# Patient Record
Sex: Female | Born: 1970 | Race: Asian
Health system: Southern US, Academic
[De-identification: ages and names within clinical notes are randomized; demographics above are authoritative.]

## PROBLEM LIST (undated history)

## (undated) ENCOUNTER — Encounter

## (undated) HISTORY — PX: OVARIAN CYST SURGERY: SHX726

## (undated) HISTORY — PX: TONSILLECTOMY: SUR1361

## (undated) HISTORY — PX: BREAST REDUCTION SURGERY: SHX8

## (undated) HISTORY — PX: HAND TENDON SURGERY: SHX663

---

## 2007-06-10 ENCOUNTER — Emergency Department (HOSPITAL_COMMUNITY): Admission: EM | Admit: 2007-06-10 | Discharge: 2007-06-10 | Payer: Self-pay | Admitting: Emergency Medicine

## 2008-10-02 ENCOUNTER — Emergency Department (HOSPITAL_BASED_OUTPATIENT_CLINIC_OR_DEPARTMENT_OTHER): Admission: EM | Admit: 2008-10-02 | Discharge: 2008-10-02 | Payer: Self-pay | Admitting: Emergency Medicine

## 2008-10-02 ENCOUNTER — Ambulatory Visit: Payer: Self-pay | Admitting: Radiology

## 2010-08-31 IMAGING — CT CT HEAD W/O CM
1 series · 16 of 30 positions shown, 20 images · non-contrast
Comparison: None

CLINICAL DATA: Migraine headaches.

CT HEAD WITHOUT CONTRAST
TECHNIQUE: Contiguous axial images were obtained from the base of
the skull through the vertex without contrast

[Series 2: head 4.8 h37s · axial · 0.46mm/px · z∈[+1192,+1329]mm · 16 of 32 slices shown, 20 images]
[im 2/32  brain]
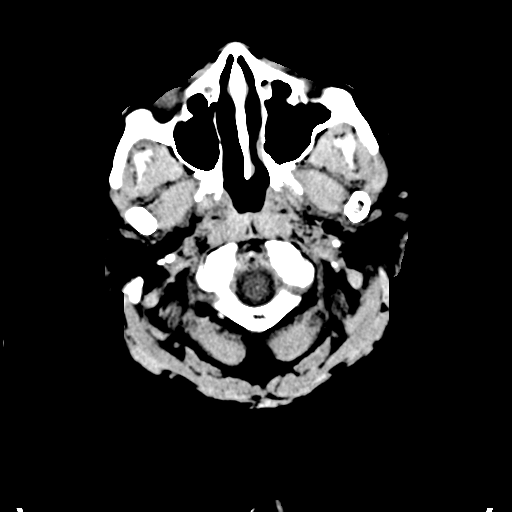
[im 2/32  bone]
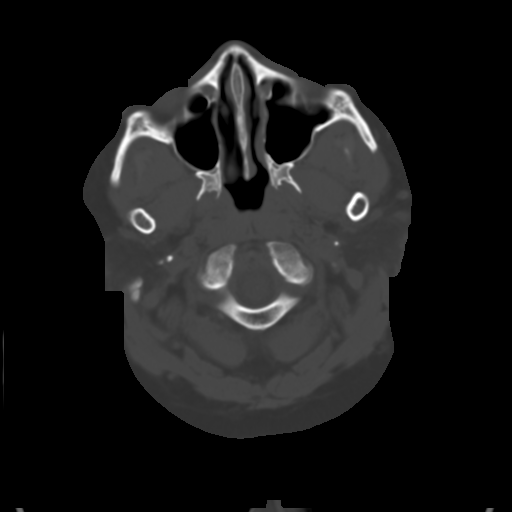
[im 4/32  brain]
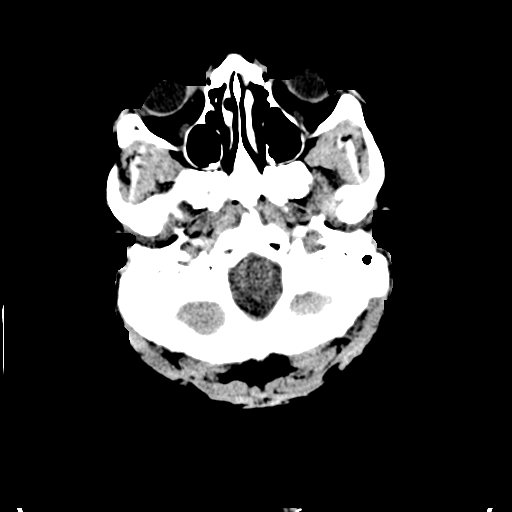
[im 6/32  brain]
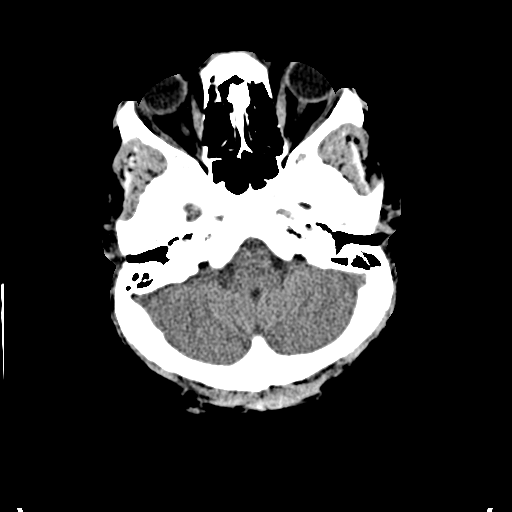
[im 8/32  brain]
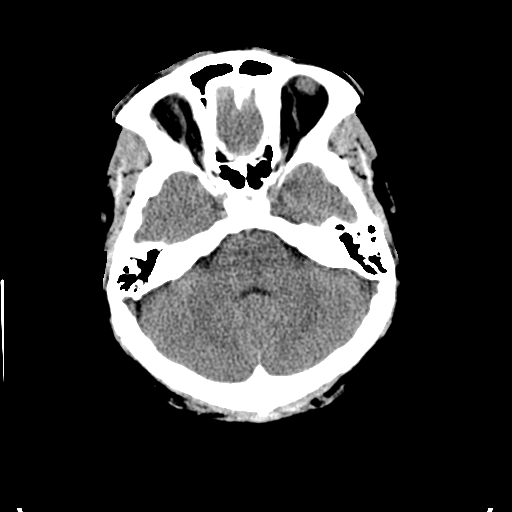
[im 9/32  brain]
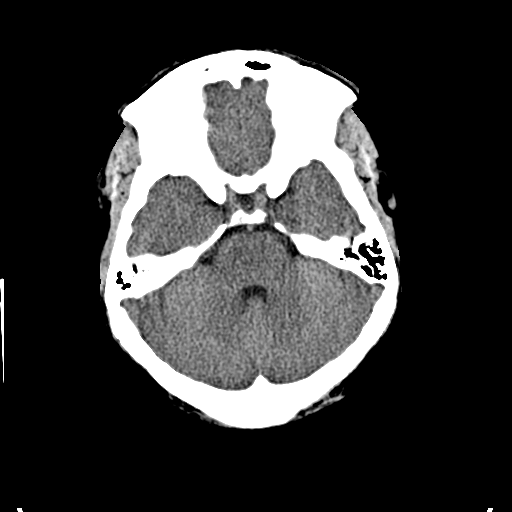
[im 9/32  bone]
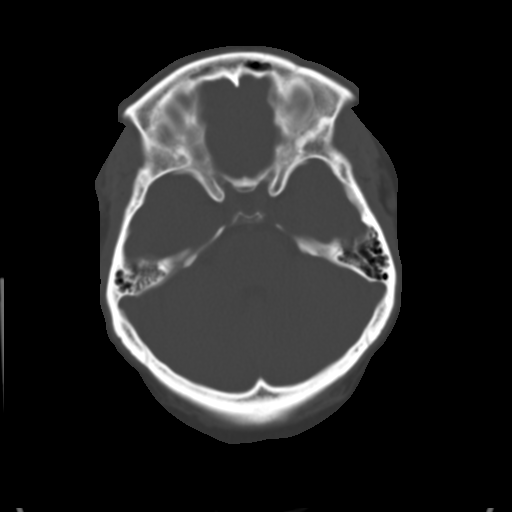
[im 11/32  brain]
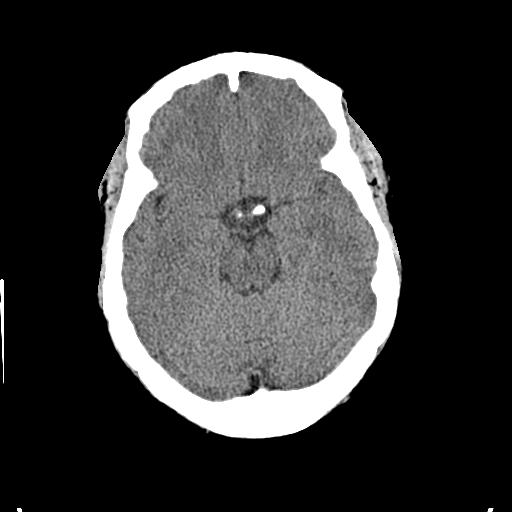
[im 13/32  brain]
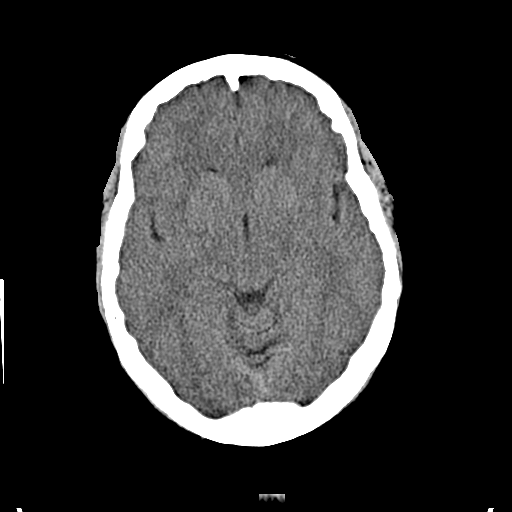
[im 15/32  brain]
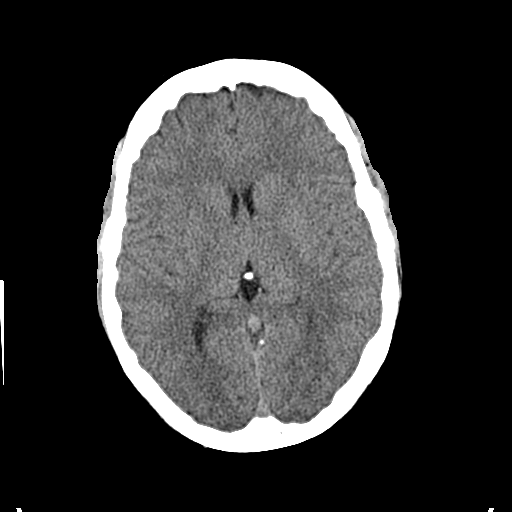
[im 17/32  brain]
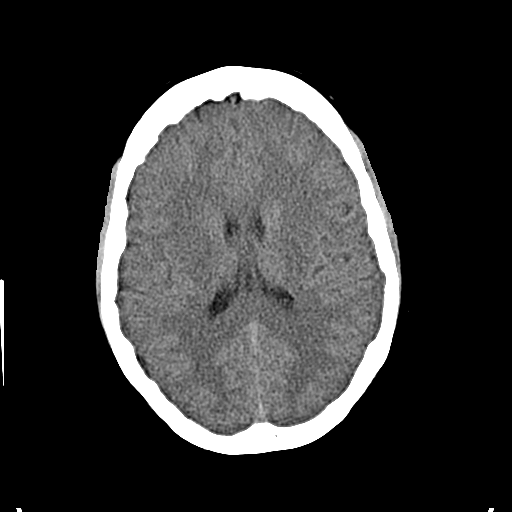
[im 17/32  bone]
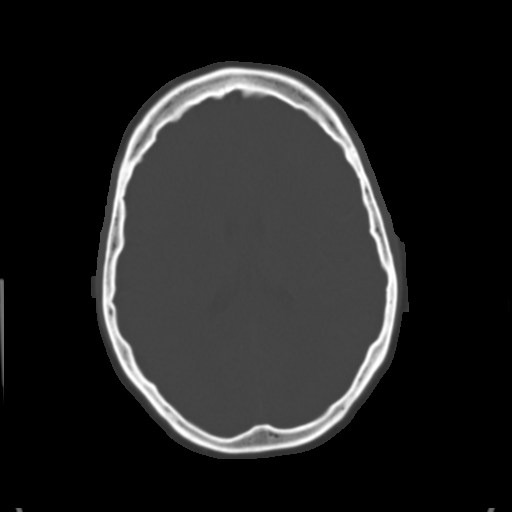
[im 19/32  brain]
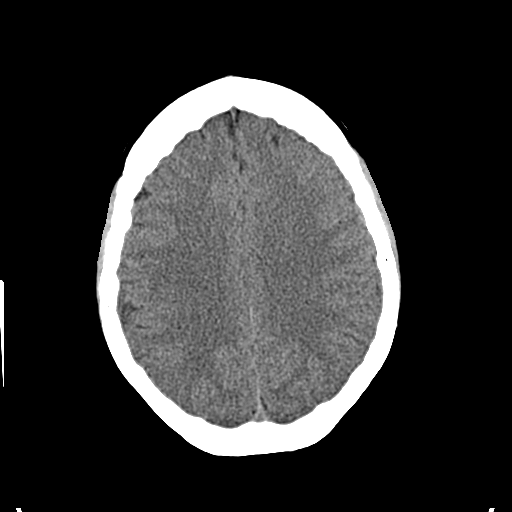
[im 21/32  brain]
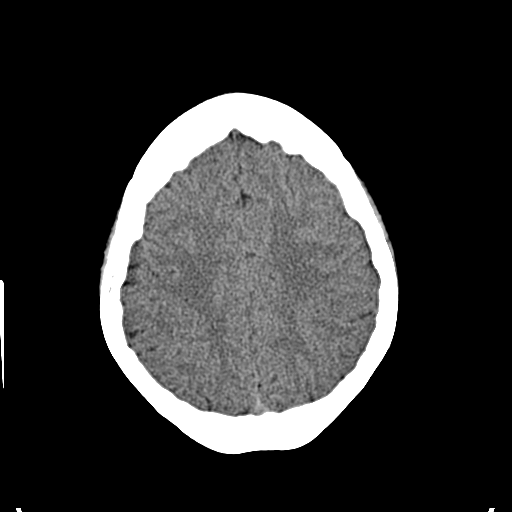
[im 23/32  brain]
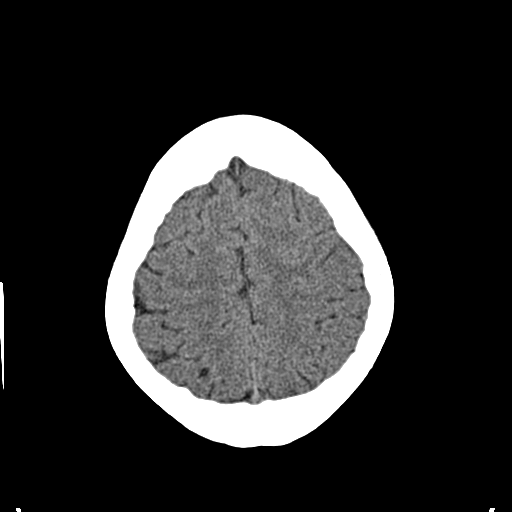
[im 24/32  brain]
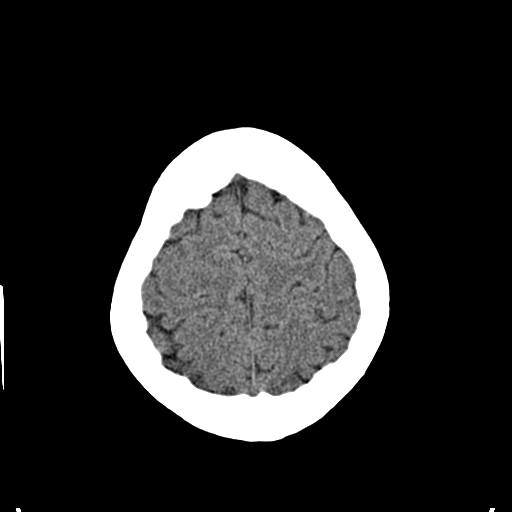
[im 24/32  bone]
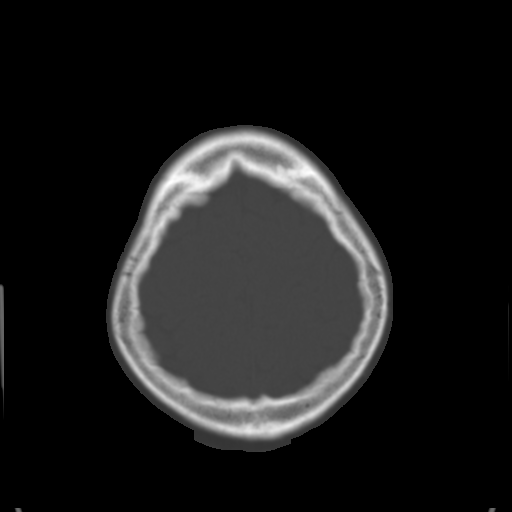
[im 26/32  brain]
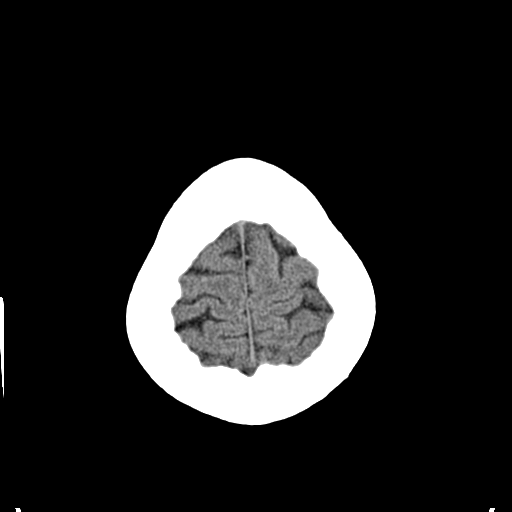
[im 28/32  brain]
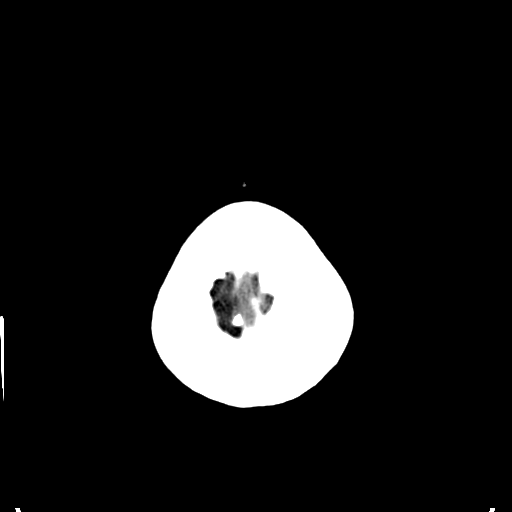
[im 30/32  brain]
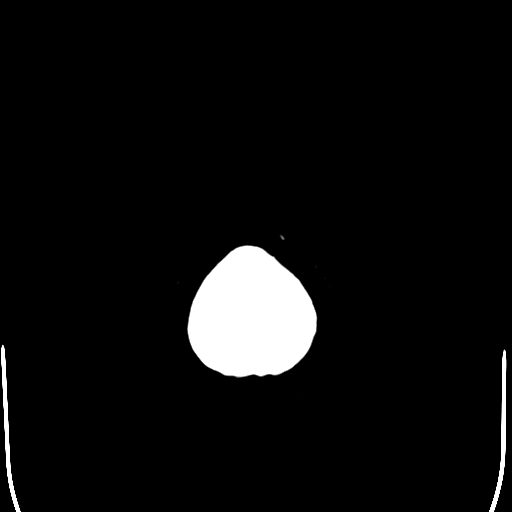

[16 of 30 positions shown; findings below may reference images not displayed]

FINDINGS: The brain has a normal appearance without evidence for
hemorrhage, acute infarction, hydrocephalus, or mass lesion.  There
is no extra axial fluid collection.  The skull and paranasal
sinuses are normal.
IMPRESSION: Normal CT of the head without contrast.

## 2010-10-15 LAB — CBC
Hemoglobin: 11.8 — ABNORMAL LOW
MCHC: 33.6
MCV: 87.5
RBC: 4.02
WBC: 4.3

## 2010-10-15 LAB — BASIC METABOLIC PANEL
CO2: 29
Calcium: 9.1
Chloride: 106
GFR calc Af Amer: >60
Sodium: 141

## 2010-10-15 LAB — DIFFERENTIAL
Basophils Relative: 0
Lymphs Abs: 1.7
Monocytes Absolute: 0.3
Monocytes Relative: 8
Neutro Abs: 2
Neutrophils Relative %: 47

## 2010-10-15 LAB — POCT CARDIAC MARKERS
CKMB, poc: 2.2
Myoglobin, poc: 71.1

## 2011-06-10 ENCOUNTER — Encounter (HOSPITAL_BASED_OUTPATIENT_CLINIC_OR_DEPARTMENT_OTHER): Payer: Self-pay

## 2011-06-10 ENCOUNTER — Emergency Department (HOSPITAL_BASED_OUTPATIENT_CLINIC_OR_DEPARTMENT_OTHER): Payer: Self-pay

## 2011-06-10 ENCOUNTER — Emergency Department (HOSPITAL_BASED_OUTPATIENT_CLINIC_OR_DEPARTMENT_OTHER)
Admission: EM | Admit: 2011-06-10 | Discharge: 2011-06-10 | Disposition: A | Discharge status: Home / Self Care | Payer: Self-pay | Attending: Emergency Medicine | Admitting: Emergency Medicine

## 2011-06-10 DIAGNOSIS — M79609 Pain in unspecified limb: Secondary | ICD-10-CM | POA: Insufficient documentation

## 2011-06-10 DIAGNOSIS — M79673 Pain in unspecified foot: Secondary | ICD-10-CM

## 2011-06-10 DIAGNOSIS — Y9229 Other specified public building as the place of occurrence of the external cause: Secondary | ICD-10-CM | POA: Insufficient documentation

## 2011-06-10 DIAGNOSIS — Z23 Encounter for immunization: Secondary | ICD-10-CM | POA: Insufficient documentation

## 2011-06-10 DIAGNOSIS — W268XXA Contact with other sharp object(s), not elsewhere classified, initial encounter: Secondary | ICD-10-CM | POA: Insufficient documentation

## 2011-06-10 MED ORDER — IBUPROFEN 600 MG PO TABS: 600.0000 mg | ORAL_TABLET | Freq: Four times a day (QID) | ORAL | Status: AC | PRN

## 2011-06-10 MED ORDER — TETANUS-DIPHTH-ACELL PERTUSSIS 5-2.5-18.5 LF-MCG/0.5 IM SUSP
0.5000 mL | Freq: Once | INTRAMUSCULAR | Status: AC
  Administered 2011-06-10: 0.5 mL via INTRAMUSCULAR
  Filled 2011-06-10: qty 0.5

## 2011-06-10 MED ORDER — OXYCODONE-ACETAMINOPHEN 5-325 MG PO TABS
1.0000 | ORAL_TABLET | Freq: Once | ORAL | Status: AC
  Administered 2011-06-10: 1 via ORAL
  Filled 2011-06-10: qty 1

## 2011-06-10 MED ORDER — OXYCODONE-ACETAMINOPHEN 5-325 MG PO TABS: 1.0000 | ORAL_TABLET | Freq: Four times a day (QID) | ORAL | Status: AC | PRN

## 2011-06-10 NOTE — Discharge Instructions (Signed)
Return to the ED with any concerns including fever, increased redness or swelling, or any other alarming symptoms  The xray of your foot tonight was normal.

## 2011-06-10 NOTE — ED Notes (Signed)
Stepped on a nail approx 1 month ago-cont'd pain to left foot

## 2011-06-13 NOTE — ED Provider Notes (Signed)
History     CSN: [id number]  Arrival date & time 06/10/11  2108   First MD Initiated Contact with Patient 06/10/11 2208      Chief Complaint  Patient presents with  . Foot Injury    (Consider location/radiation/quality/duration/timing/severity/associated sxs/prior treatment) HPI Pt presents with pain in her left foot.  She states that she was at [employer] and stepped on a tack approx 1 month ago- since that time has noted pain in her left foot.   However, the area that was punctured by tack is on her posterior sole of foot- area of pain is at distal ball of foot near 2nd toe.  Area of tack puncture has completely healed- no pain in that area, no redness.  She describes pain near 2nd toe as sharp and constant.  Has tried OTC meds without relief.  Pt has had no other trauma or injury to foot.  Did not receive tetanus after the tack incident.  Palpation makes pain worse, there is nothing that makes it better.  There are no other associated systemic symptoms.   History reviewed. No pertinent past medical history.  Past Surgical History  Procedure Date  . Hand tendon surgery   . Breast reduction surgery   . Ovarian cyst surgery     No family history on file.  History  Substance Use Topics  . Smoking status: Never Smoker   . Smokeless tobacco: Not on file  . Alcohol Use: Yes    OB History    Grav Para Term Preterm Abortions TAB SAB Ect Mult Living                  Review of Systems ROS reviewed and all otherwise negative except for mentioned in HPI  Allergies  Penicillins  Home Medications   Current Outpatient Rx  Name Route Sig Dispense Refill  . COUGH DROPS MT Mouth/Throat Use as directed 1 lozenge in the mouth or throat daily as needed. Patient used this medication for her cough.    . IBUPROFEN 600 MG PO TABS Oral Take 1 tablet (600 mg total) by mouth every 6 (six) hours as needed for pain. 30 tablet 0  . OXYCODONE-ACETAMINOPHEN 5-325 MG PO TABS Oral Take 1-2 tablets  by mouth every 6 (six) hours as needed for pain. 15 tablet 0    BP 134/81  Pulse 80  Temp(Src) 98.3 F (36.8 C) (Tympanic)  Resp 16  Ht 5\' 4"  (1.626 m)  Wt 265 lb (120.203 kg)  BMI 45.49 kg/m2  SpO2 99% Vitals reviewed Physical Exam Physical Examination: General appearance - alert, well appearing, and in no distress Mental status - alert, oriented to person, place, and time Mouth - mucous membranes moist, pharynx normal without lesions Chest - clear to auscultation, no wheezes, rales or rhonchi, symmetric air entry Heart - normal rate, regular rhythm, normal S1, S2, no murmurs, rubs, clicks or gallops Abdomen - soft, nontender, nondistended, no masses or organomegaly Musculoskeletal - no joint tenderness, deformity or swelling, ttp on plantar surface of left 2nd toe near MP joint Extremities - peripheral pulses normal, no pedal edema, no clubbing or cyanosis Skin - normal coloration and turgor, no rashes  ED Course  Procedures (including critical care time)  Labs Reviewed - No data to display No results found.   1. Foot pain       MDM  Pt presents with c/o pain in left foot- I do not think this is related to the prior tack puncture  as the symptoms are in a totally different location.  Tetanus was updated today.  xrays reassuring.  Pt discharged with stirct return precautions.  Symptoms may be due to arthritis, possible gout- doubt septic joint.  Pt is agreeable with plan for discharge.         Ethelda Chick, MD 06/13/11 [phone]

## 2013-05-08 IMAGING — CR DG FOOT COMPLETE 3+V*L*
2 series · 2 of 2 positions shown · non-contrast
Comparison: None.

CLINICAL DATA: Persistent left foot pain after stepping on a nail
approximately 1 month ago.

LEFT FOOT - COMPLETE 3+ VIEW

[t foot oblique left]
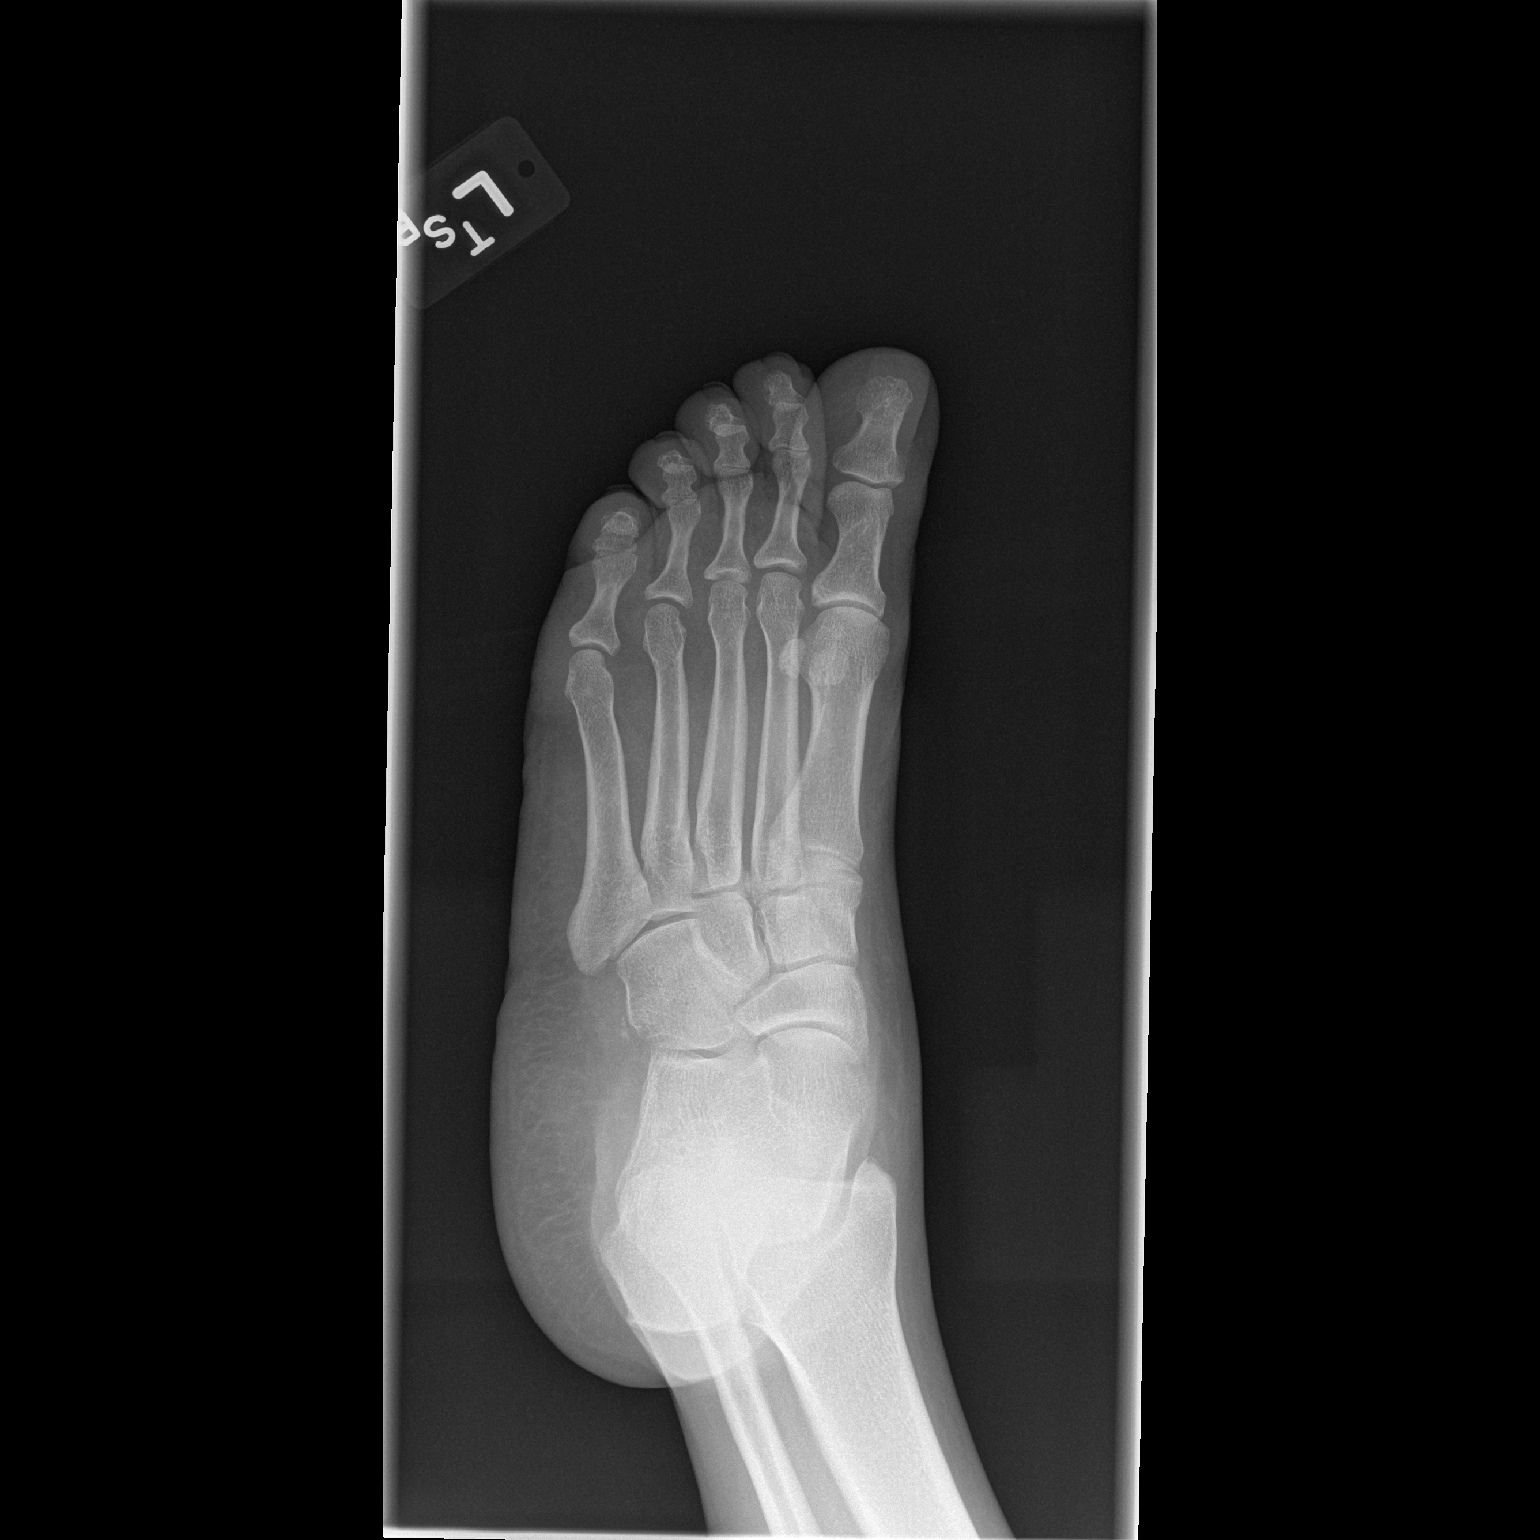

[t foot lat left]
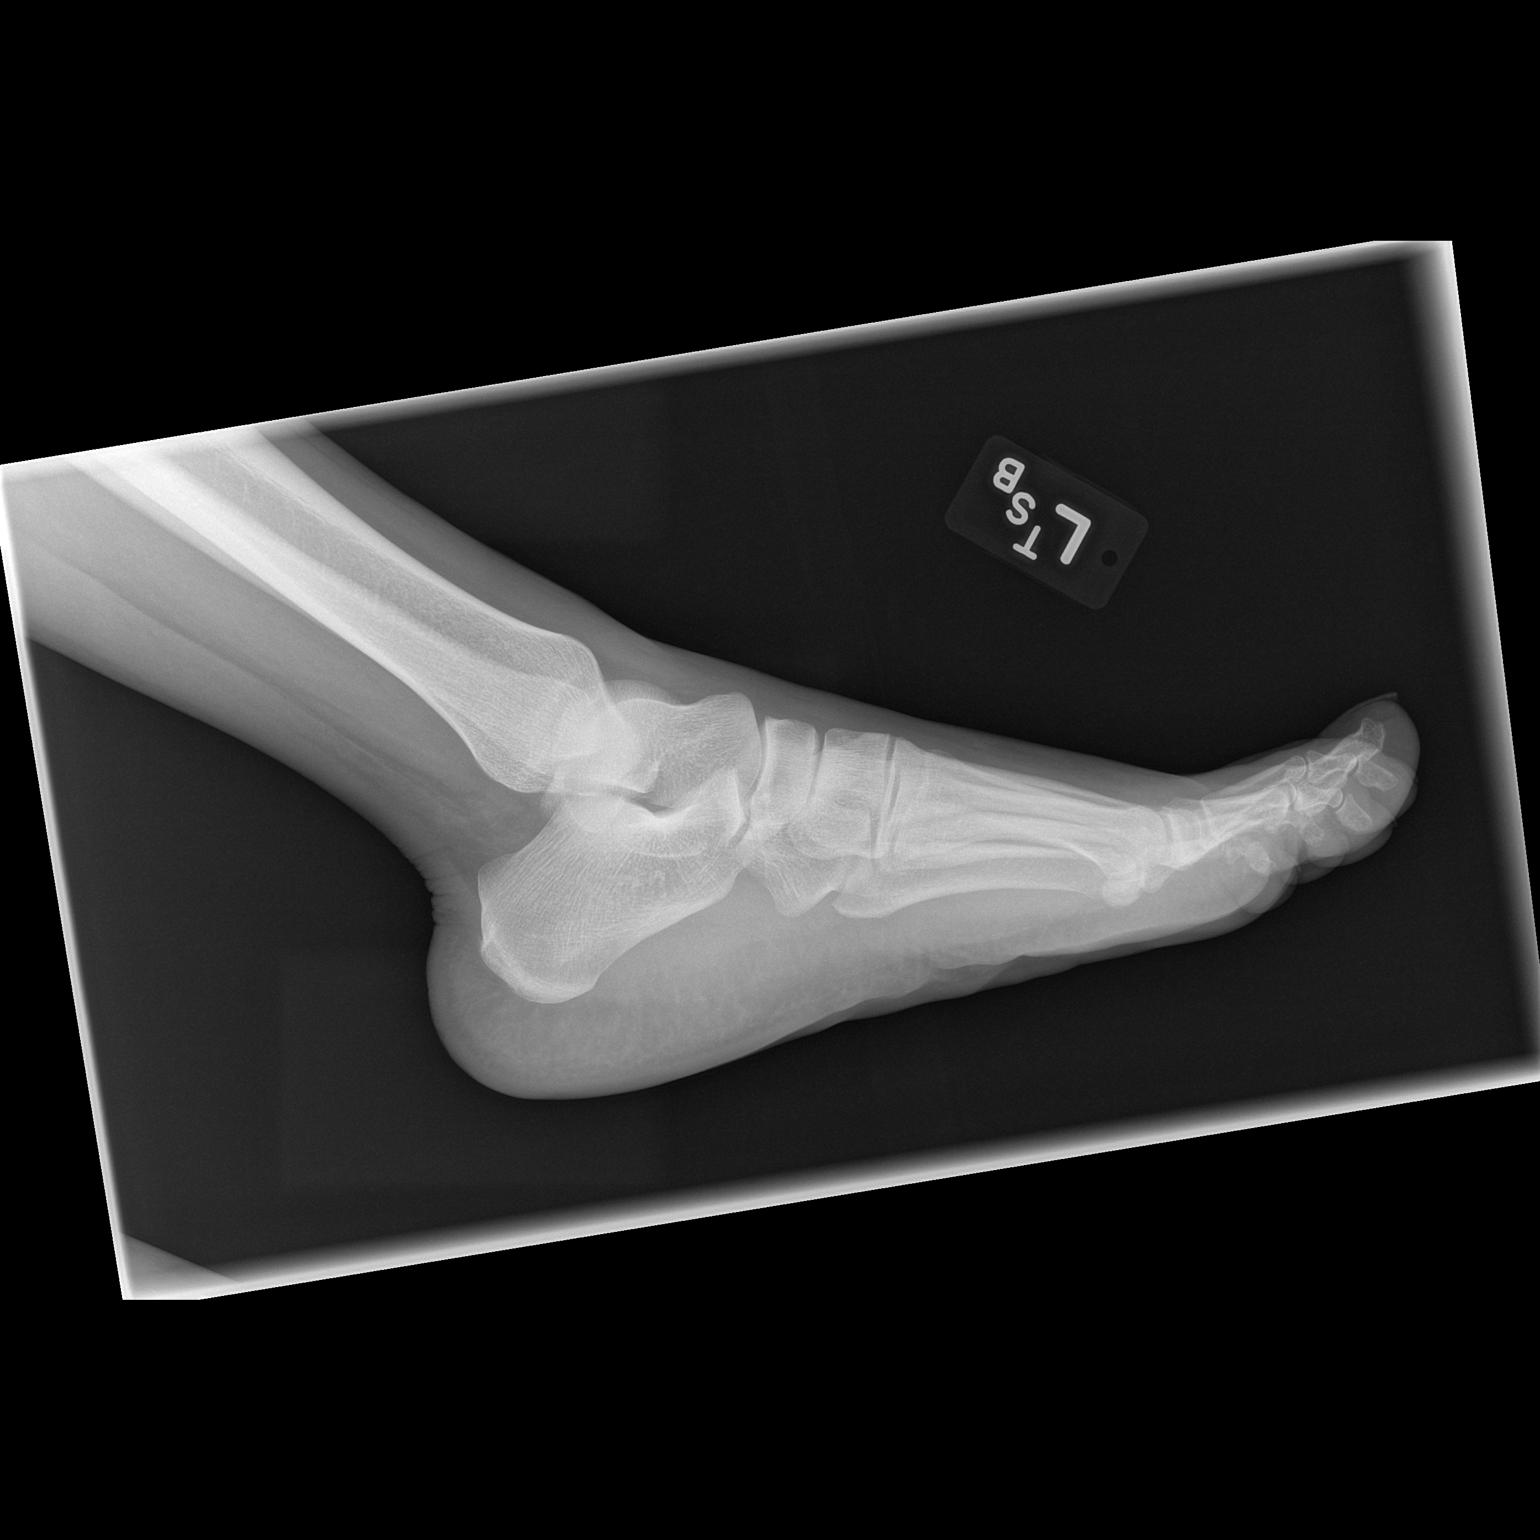

[2 of 2 positions shown; findings below may reference images not displayed]

FINDINGS: No opaque foreign body identified in the soft tissues. No
evidence of acute or subacute fracture or dislocation.  Well-
preserved joint spaces.  Well-preserved bone mineral density.
Accessory ossicles adjacent to the cuboid bone.  No significant
intrinsic osseous abnormalities.
IMPRESSION: No acute, subacute, or significant osseous abnormality.  No opaque
foreign body in the soft tissues.

## 2014-01-21 ENCOUNTER — Emergency Department (HOSPITAL_BASED_OUTPATIENT_CLINIC_OR_DEPARTMENT_OTHER)
Admission: EM | Admit: 2014-01-21 | Discharge: 2014-01-21 | Disposition: A | Discharge status: Home / Self Care | Payer: 59 | Attending: Emergency Medicine | Admitting: Emergency Medicine

## 2014-01-21 ENCOUNTER — Encounter (HOSPITAL_BASED_OUTPATIENT_CLINIC_OR_DEPARTMENT_OTHER): Payer: Self-pay | Admitting: *Deleted

## 2014-01-21 DIAGNOSIS — H109 Unspecified conjunctivitis: Secondary | ICD-10-CM | POA: Diagnosis not present

## 2014-01-21 DIAGNOSIS — R05 Cough: Secondary | ICD-10-CM | POA: Diagnosis not present

## 2014-01-21 DIAGNOSIS — Z880 Allergy status to penicillin: Secondary | ICD-10-CM | POA: Diagnosis not present

## 2014-01-21 DIAGNOSIS — H578 Other specified disorders of eye and adnexa: Secondary | ICD-10-CM | POA: Diagnosis present

## 2014-01-21 MED ORDER — ERYTHROMYCIN 5 MG/GM OP OINT
TOPICAL_OINTMENT | Freq: Once | OPHTHALMIC | Status: AC
  Administered 2014-01-21: 1 via OPHTHALMIC
  Filled 2014-01-21: qty 3.5

## 2014-01-21 NOTE — ED Notes (Signed)
Erythromycin ointment given per MD order with instructions- note for work provided- pt has a ride at bedside

## 2014-01-21 NOTE — ED Provider Notes (Signed)
CSN: [id number]     Arrival date & time 01/21/14  1416 History   First MD Initiated Contact with Patient 01/21/14 1434     Chief Complaint  Patient presents with  . Conjunctivitis     (Consider location/radiation/quality/duration/timing/severity/associated sxs/prior Treatment) The history is provided by the patient.  Rachel Hendricks is a 44 y.o. female here with right eye drainage and redness. Woke up this morning with redness right eye with some whitish discharge. Doesn't wear contacts. Denies fevers. Had some sore throat and cough yesterday that improved. Never smoker.    History reviewed. No pertinent past medical history. Past Surgical History  Procedure Laterality Date  . Hand tendon surgery    . Breast reduction surgery    . Ovarian cyst surgery     No family history on file. History  Substance Use Topics  . Smoking status: Never Smoker   . Smokeless tobacco: Not on file  . Alcohol Use: Yes   OB History    No data available     Review of Systems  HENT:       R eye redness   Respiratory: Positive for cough.   All other systems reviewed and are negative.     Allergies  Penicillins  Home Medications   Prior to Admission medications   Medication Sig Start Date End Date Taking? Authorizing Provider  Throat Lozenges (COUGH DROPS MT) Use as directed 1 lozenge in the mouth or throat daily as needed. Patient used this medication for her cough.    Historical Provider, MD   Pulse 82  Temp(Src) 98.3 F (36.8 C) (Oral)  Resp 18  Ht  (1.626 m)  Wt 265 lb (120.203 kg)  BMI 45.46 kg/m2  SpO2 100% Physical Exam  Constitutional: She is oriented to person, place, and time. She appears well-developed and well-nourished.  HENT:  Head: Normocephalic.  Mouth/Throat: Oropharynx is clear and moist.  OP not red   Eyes:  R eye conjunctivitis. No purulent discharge. No obvious foreign body behind lids.   Neck: Normal range of motion. Neck supple.   Cardiovascular: Normal rate, regular rhythm and normal heart sounds.   Pulmonary/Chest: Effort normal and breath sounds normal. No respiratory distress. She has no wheezes. She has no rales.  Abdominal: Soft. Bowel sounds are normal. She exhibits no distension. There is no tenderness. There is no rebound and no guarding.  Musculoskeletal: Normal range of motion. She exhibits no edema or tenderness.  Neurological: She is alert and oriented to person, place, and time. No cranial nerve deficit. Coordination normal.  Skin: Skin is warm.  Psychiatric: She has a normal mood and affect. Her behavior is normal. Judgment and thought content normal.  Nursing note and vitals reviewed.   ED Course  Procedures (including critical care time) Labs Review Labs Reviewed - No data to display  Imaging Review No results found.   EKG Interpretation None      MDM   Final diagnoses:  None   Rachel Hendricks is a 44 y.o. female here with R eye conjunctivitis. Also has viral syndrome so I think likely viral. Doesn't use contacts. Will dc with erythromycin ointment for a week for prophylactic treatment.      Richardean Canal, MD 01/21/14 [phone]

## 2014-01-21 NOTE — Discharge Instructions (Signed)
Apply erythromycin ointment to right lower eyelid twice a day for a week.   See an eye doctor in a week if you are not better.   Return to ER if you have fever, purulent drainage from the eye, severe eye pain.

## 2014-01-21 NOTE — ED Notes (Signed)
Right eye draining and red.

## 2014-01-28 ENCOUNTER — Encounter (HOSPITAL_BASED_OUTPATIENT_CLINIC_OR_DEPARTMENT_OTHER): Payer: Self-pay | Admitting: *Deleted

## 2014-01-28 ENCOUNTER — Emergency Department (HOSPITAL_BASED_OUTPATIENT_CLINIC_OR_DEPARTMENT_OTHER)
Admission: EM | Admit: 2014-01-28 | Discharge: 2014-01-28 | Disposition: A | Discharge status: Home / Self Care | Payer: 59 | Attending: Emergency Medicine | Admitting: Emergency Medicine

## 2014-01-28 DIAGNOSIS — Z880 Allergy status to penicillin: Secondary | ICD-10-CM | POA: Diagnosis not present

## 2014-01-28 DIAGNOSIS — J029 Acute pharyngitis, unspecified: Secondary | ICD-10-CM | POA: Diagnosis not present

## 2014-01-28 LAB — RAPID STREP SCREEN (MED CTR MEBANE ONLY): Streptococcus, Group A Screen (Direct): NEGATIVE

## 2014-01-28 MED ORDER — NAPROXEN 500 MG PO TABS: 500.0000 mg | ORAL_TABLET | Freq: Two times a day (BID) | ORAL | Status: AC

## 2014-01-28 MED ORDER — AZITHROMYCIN 250 MG PO TABS: 250.0000 mg | ORAL_TABLET | Freq: Every day | ORAL | Status: AC

## 2014-01-28 MED ORDER — HYDROCODONE-ACETAMINOPHEN 7.5-325 MG/15ML PO SOLN
15.0000 mL | Freq: Three times a day (TID) | ORAL | Status: AC | PRN
Start: 2014-01-28 — End: ?

## 2014-01-28 NOTE — ED Notes (Addendum)
Pt reports sore throat x 1 week.  Pt seen in ED for same.  Denies fever. Reports coughing that hurts her throat.  Non-productive cough

## 2014-01-28 NOTE — ED Provider Notes (Signed)
CSN: [id number]     Arrival date & time 01/28/14  1903 History  This chart was scribed for Vida Roller, MD by Evon Slack, ED Scribe. This patient was seen in room MHFT1/MHFT1 and the patient's care was started at 9:36 PM.      Chief Complaint  Patient presents with  . Sore Throat   Patient is a 44 y.o. female presenting with pharyngitis. The history is provided by the patient. No language interpreter was used.  Sore Throat   HPI Comments: Rachel Hendricks is a 44 y.o. female who presents to the Emergency Department complaining of sore throat onset 1 week prior. She states that it is painful to swallow solids or liquids. Pt state she has slight cough as well.  Pt states she has tried Ricola, hot tea, and salt water gargle with no relief. Pt denies recent sick contacts. Pt denies ear pain, tinnitus or postnasal drip. Pt states she was in the ED for pink eye when she had similar symptoms that weren't treated.   History reviewed. No pertinent past medical history. Past Surgical History  Procedure Laterality Date  . Hand tendon surgery    . Breast reduction surgery    . Ovarian cyst surgery    . Tonsillectomy     History reviewed. No pertinent family history. History  Substance Use Topics  . Smoking status: Never Smoker   . Smokeless tobacco: Not on file  . Alcohol Use: Yes   OB History    No data available     Review of Systems  HENT: Positive for sore throat. Negative for ear pain, postnasal drip and tinnitus.   Respiratory: Positive for cough.      Allergies  Penicillins  Home Medications   Prior to Admission medications   Medication Sig Start Date End Date Taking? Authorizing Provider  azithromycin (ZITHROMAX Z-PAK) 250 MG tablet Take 1 tablet (250 mg total) by mouth daily.  PO day 1, then  PO days 205 01/28/14   Vida Roller, MD  HYDROcodone-acetaminophen (HYCET) 7.5-325 mg/15 ml solution Take 15 mLs by mouth every 8 (eight) hours as needed for  moderate pain. 01/28/14   Vida Roller, MD  naproxen (NAPROSYN) 500 MG tablet Take 1 tablet (500 mg total) by mouth 2 (two) times daily with a meal. 01/28/14   Vida Roller, MD  Throat Lozenges (COUGH DROPS MT) Use as directed 1 lozenge in the mouth or throat daily as needed. Patient used this medication for her cough.    Historical Provider, MD   Triage Vitals: BP 150/90 mmHg  Pulse 84  Temp(Src) 97.8 F (36.6 C) (Oral)  Resp 18  Wt 270 lb (122.471 kg)  SpO2 100%  Physical Exam  Constitutional: She appears well-developed and well-nourished.  HENT:  Head: Normocephalic and atraumatic.  Eyes: Conjunctivae are normal. Right eye exhibits no discharge. Left eye exhibits no discharge.  Pulmonary/Chest: Effort normal. No respiratory distress.  Neurological: She is alert. Coordination normal.  Skin: Skin is warm and dry. No rash noted. She is not diaphoretic. No erythema.  Psychiatric: She has a normal mood and affect.  Nursing note and vitals reviewed.   ED Course  Procedures (including critical care time) DIAGNOSTIC STUDIES: Oxygen Saturation is 100% on RA, normal by my interpretation.    COORDINATION OF CARE: 9:46 PM-Discussed treatment plan with pt at bedside and pt agreed to plan.     Labs Review Labs Reviewed  RAPID STREP SCREEN  CULTURE, GROUP  A STREP    Imaging Review No results found.   MDM   Final diagnoses:  Pharyngitis   The patient is well-appearing, no signs of streptococcal pharyngitis, strep test negative, labs negative, medications as below.  Oropharynx is clear and moist, phonation is normal, vitals are normal.   Meds given in ED:  Medications - No data to display  New Prescriptions   AZITHROMYCIN (ZITHROMAX Z-PAK) 250 MG TABLET    Take 1 tablet (250 mg total) by mouth daily. 500mg  PO day 1, then 250mg  PO days 205   HYDROCODONE-ACETAMINOPHEN (HYCET) 7.5-325 MG/15 ML SOLUTION    Take 15 mLs by mouth every 8 (eight) hours as needed for moderate  pain.   NAPROXEN (NAPROSYN) 500 MG TABLET    Take 1 tablet (500 mg total) by mouth 2 (two) times daily with a meal.      I personally performed the services described in this documentation, which was scribed in my presence. The recorded information has been reviewed and is accurate.        Vida RollerBrian D Glenis Musolf, MD 01/28/14 2215

## 2014-01-28 NOTE — Discharge Instructions (Signed)
Please call your doctor for a followup appointment within 24-48 hours. When you talk to your doctor please let them know that you were seen in the emergency department and have them acquire all of your records so that they can discuss the findings with you and formulate a treatment plan to fully care for your new and ongoing problems. ° °

## 2014-01-31 LAB — CULTURE, GROUP A STREP

## 2016-04-15 ENCOUNTER — Emergency Department (HOSPITAL_BASED_OUTPATIENT_CLINIC_OR_DEPARTMENT_OTHER): Admission: EM | Admit: 2016-04-15 | Discharge: 2016-04-15 | Discharge status: Absent without leave | Payer: 59

## 2016-05-03 ENCOUNTER — Inpatient Hospital Stay: Admit: 2016-05-03 | Discharge: 2016-05-03

## 2016-05-03 DIAGNOSIS — Z880 Allergy status to penicillin: Secondary | ICD-10-CM

## 2016-05-03 DIAGNOSIS — L259 Unspecified contact dermatitis, unspecified cause: Secondary | ICD-10-CM

## 2016-05-03 DIAGNOSIS — R21 Rash and other nonspecific skin eruption: Principal | ICD-10-CM

## 2016-05-03 MED ORDER — DIPHENHYDRAMINE HCL 25 MG PO CAPS
50 mg | Freq: Once | ORAL | Status: CP
Start: 2016-05-03 — End: ?

## 2016-05-03 NOTE — ED Provider Notes
reactive to light. Right eye exhibits no discharge. Left eye exhibits no discharge. No scleral icterus.   Neck: Normal range of motion. Neck supple.   Cardiovascular: Normal rate, regular rhythm, normal heart sounds and intact distal pulses.  Exam reveals no gallop and no friction rub.    No murmur heard.  Pulmonary/Chest: Effort normal and breath sounds normal. No respiratory distress. She has no wheezes. She has no rales. She exhibits no tenderness.   Abdominal: Soft. Bowel sounds are normal. She exhibits no distension and no mass. There is no tenderness. There is no rebound and no guarding.   Musculoskeletal: Normal range of motion. She exhibits no edema or deformity.   Neurological: She is alert.   Ambulates unassisted with stable gait   Skin: Skin is warm and dry. Rash noted. She is not diaphoretic.   Urticarial rash over left breast, left axilla, left bicep, and very minimal erythema to left cheek. Area is not fluctuant or tender but is warm. Soft tissue does not feel indurated and does not appear infected. Nipple is not inverted. No nipple discharge. No bites. No weeping. No blisters   Psychiatric: She has a normal mood and affect. Her behavior is normal. Judgment and thought content normal.   Nursing note and vitals reviewed.    Differential DDx: contact dermatitis, allergic reaction, mastitis, infection, other    Is this an Emergent Medical Condition? Yes - Severe Pain/Acute Onset of Symptons  409.901 FS  641.19 FS  627.732 (16) FS    ED Workup   Procedures    Labs:  - - No data to display      Imaging (Read by ED Provider):  not applicable      EKG (Read by ED Provider):  not applicable        ED Course & Re-Evaluation     ED Course   Comment By Time   Patient presents with urticarial rash over face, chest and arm on left side of body after sleeping on left side curled with a pillow at a hotel. Denies itchiness, just c.o feeling warm. No new food exposures. This was

## 2016-05-03 NOTE — ED Triage Notes
Patient states she woke up with rash on her lft side of face, left arm, left chest. Denies shortness of breath. Denies chest pain. Denies itching feeling states it feels warm to touch.

## 2016-05-03 NOTE — ED Notes
Time of discharge: 1755  PM., Patient discharged to  Home.  Patient discharged  ambulatory. to exit with belongings in  Stable condition.  Patient escorted by  no one., Written discharge instructions given to  patient.  Patient/recipient  verbalizes discharge instructions. Patient verbalized understanding of return instructions if symptoms worsen or difficulty breathing developes. Patient verbalized understanding. Patient denies pain at time of discharge.

## 2016-05-03 NOTE — ED Provider Notes
History     Chief Complaint   Patient presents with   ? Rash       HPI Comments: 46 year old female presents with her husband for a left sided rash that she noticed upon waking this morning. She is visiting from [place] to meet her son's fiance's father. They had a cook out last evening and she had no new food exposures. She has no known allergies. She stayed at a hotel last night and awoke with burning sensation over left face left arm and left breast in distribution of where she was holding pillow. Hotel showed her the cleaning products they use and they are all new to her. No shortness of breath, wheezing, hoarseness, or stridor    Patient is a 46 y.o. female presenting with Allergic Rash. The history is provided by the patient and the spouse. No language interpreter was used.   Allergic Rash   Presenting symptoms: rash and swelling    Presenting symptoms: no difficulty breathing, no difficulty swallowing, no itching and no wheezing    Rash:     Location:  Face, breast and arm    Quality: burning      Severity:  Moderate    Onset quality:  Sudden    Duration:  1 day    Timing:  Constant    Progression:  Unchanged  Swelling:     Onset quality:  Sudden    Timing:  Constant    Progression:  Unchanged    Chronicity:  New  Severity:  Mild  Duration:  1 day  Prior allergic episodes:  No prior episodes  Context: new detergents/soaps    Context: not animal exposure, not cosmetics, not food allergies and not medications    Relieved by:  None tried      Allergies   Allergen Reactions   ? Pcn [Penicillins] Hives       Patient's Medications    No medications on file       History reviewed. No pertinent past medical history.    History reviewed. No pertinent surgical history.    No family history on file.    Social History     Social History   ? Marital status: Married     Spouse name: N/A   ? Number of children: N/A   ? Years of education: N/A     Social History Main Topics   ? Smoking status: Never Smoker

## 2016-05-03 NOTE — ED Provider Notes
History     Chief Complaint   Patient presents with   ? Rash       HPI Comments: 46 year old female presents with her husband for a left sided rash that she noticed upon waking this morning. She is visiting from [place] to meet her son's fiance's father. They had a cook out last evening and she had no new food exposures. She has no known allergies. She stayed at a hotel last night    Patient is a 46 y.o. female presenting with Allergic Rash. The history is provided by the patient and the spouse. No language interpreter was used.   Allergic Rash   Presenting symptoms: rash and swelling    Presenting symptoms: no difficulty breathing, no difficulty swallowing, no itching and no wheezing    Rash:     Location:  Face, breast and arm    Quality: burning      Severity:  Moderate    Onset quality:  Sudden    Duration:  1 day    Timing:  Constant    Progression:  Unchanged  Swelling:     Onset quality:  Sudden    Timing:  Constant    Progression:  Unchanged    Chronicity:  New  Severity:  Mild  Duration:  1 day  Prior allergic episodes:  No prior episodes  Context: new detergents/soaps    Context: not animal exposure, not cosmetics, not food allergies and not medications    Relieved by:  None tried      Allergies   Allergen Reactions   ? Pcn [Penicillins] Hives       Patient's Medications    No medications on file       History reviewed. No pertinent past medical history.    History reviewed. No pertinent surgical history.    No family history on file.    Social History     Social History   ? Marital status: Married     Spouse name: N/A   ? Number of children: N/A   ? Years of education: N/A     Social History Main Topics   ? Smoking status: Never Smoker   ? Smokeless tobacco: Never Used   ? Alcohol use 5.0 oz/week     1 Glasses of wine per week   ? Drug use: No   ? Sexual activity: Not Asked     Other Topics Concern   ? None     Social History Narrative   ? None       Review of Systems

## 2016-05-03 NOTE — ED Provider Notes
?   Smokeless tobacco: Never Used   ? Alcohol use 5.0 oz/week     1 Glasses of wine per week   ? Drug use: No   ? Sexual activity: Not Asked     Other Topics Concern   ? None     Social History Narrative   ? None       Review of Systems   Constitutional: Negative for fever, chills, diaphoresis and fatigue.   HENT: Negative for ear pain, nasal congestion, sore throat, trouble swallowing, neck pain and voice change.    Eyes: Negative for photophobia and visual disturbance.   Respiratory: Negative for cough, chest tightness, shortness of breath, wheezing and stridor.    Cardiovascular: Negative for chest pain, palpitations and leg swelling.   Gastrointestinal: Negative for nausea, vomiting, abdominal pain, diarrhea and constipation.   Genitourinary: Negative for dysuria, hematuria and flank pain.   Musculoskeletal: Negative for myalgias, arthralgias and gait problem.   Skin: Positive for color change and rash. Negative for itching, pallor and wound.   Neurological: Negative for dizziness, weakness, light-headedness, numbness and headaches.   Allergic/Immunologic: Positive for urticaria. Negative for environmental allergies, food allergies and immunocompromised state.   All other systems reviewed and are negative.      Physical Exam       ED Triage Vitals   BP 05/03/16 1552 133/88   Pulse 05/03/16 1552 87   Resp 05/03/16 1552 20   Temp 05/03/16 1552 36.7 ?C (98 ?F)   Temp src 05/03/16 1552 Oral   Height 05/03/16 1552 1.626 m   Weight 05/03/16 1552 94.3 kg   SpO2 05/03/16 1552 100 %   BMI (Calculated) 05/03/16 1552 35.78     Physical Exam   Constitutional: She appears well-developed and well-nourished. No distress.   HENT:   Head: Normocephalic and atraumatic.   Right Ear: External ear normal.   Left Ear: External ear normal.   Nose: Nose normal.   Mouth/Throat: Oropharynx is clear and moist. No oropharyngeal exudate.   Eyes: Conjunctivae and EOM are normal. Pupils are equal, round, and

## 2016-05-03 NOTE — ED Notes
Nurse educated patient on need for urine sample for pregnancy testing. Patient states, "Im post menopausal for 10 years, Orrin Brigham rather not". MD notified.

## 2016-05-03 NOTE — ED Provider Notes
HENT: Negative for trouble swallowing.    Respiratory: Negative for wheezing.    Skin: Positive for rash. Negative for itching.       Physical Exam     ED Triage Vitals   BP 05/03/16 1552 133/88   Pulse 05/03/16 1552 87   Resp 05/03/16 1552 20   Temp 05/03/16 1552 36.7 ?C (98 ?F)   Temp src 05/03/16 1552 Oral   Height 05/03/16 1552 1.626 m   Weight 05/03/16 1552 94.3 kg   SpO2 05/03/16 1552 100 %   BMI (Calculated) 05/03/16 1552 35.78             Physical Exam    Differential DDx: ***    Is this an Emergent Medical Condition? {SH ED EMERGENT MEDICAL [phone]}  409.901 FS  641.19 FS  627.732 (16) FS    ED Workup   Procedures    Labs:  - - No data to display      Imaging (Read by ED Provider):  {Imaging findings:[phone]}      EKG (Read by ED Provider):  {EKG [phone]}        ED Course & Re-Evaluation     ED Course   Comment By Time   Patient presents with urticarial rash over face, chest and arm on left side of body after sleeping on left side curled with a pillow at a hotel. Denies itchiness, just c.o feeling warm. No new food exposures. This was her first night at this hotel and hasn't tried anything for symptoms. Giving benadryl for contact dermatitis and advised to use different bedding tonight if she will be going back to hotel. No oral swelling, no shortness of breath. Lucretia Field, MD 04/15 1622         MDM   Decide to obtain history from someone other than the patient: {SH ED Lamonte Sakai MDM - OBTAIN ZOXWRUE:45409}    Decide to obtain previous medical records: [facility] ED Lamonte Sakai MDM - PREVIOUS MED REC - NO WJX:91478}    Clinical Lab Test(s): {SH ED Lamonte Sakai MDM ORDERED AND REVIEWED:28124}    Diagnostic Tests (Radiology, EKG): {SH ED Lamonte Sakai MDM ORDERED AND REVIEWED:28124}    Independent Visualization (ED [place], Wet Prep, Other): {SH ED Lamonte Sakai MDM NO YES GNFAOZHY:86578}    Discussed patient with NON-ED Provider: {SH ED Lamonte Sakai MDM - ANOTHER IONGEXBM:84132}      ED Disposition   ED Disposition: No ED Disposition Set

## 2016-05-03 NOTE — ED Provider Notes
ED Clinical Impression   ED Clinical Impression:   No Clinical Impression Set      ED Patient Status   Patient Status:   {SH ED JX PATIENT [phone]}        ED Medical Evaluation Initiated   Medical Evaluation Initiated:   Yes, filed at 05/03/16 1600  by Lucretia Field, MD

## 2016-05-03 NOTE — ED Provider Notes
her first night at this hotel and hasn't tried anything for symptoms. Giving benadryl for contact dermatitis and advised to use different bedding tonight if she will be going back to hotel. No oral swelling, no shortness of breath. Lucretia Field, MD 04/15 [phone]   Patient is postmenopausal  Pregnancy test dc'd Lucretia Field, MD 04/15 1711         MDM   Decide to obtain history from someone other than the patient: Yes - Family/ Caregiver    Decide to obtain previous medical records: No    Clinical Lab Test(s): N/A    Diagnostic Tests (Radiology, EKG): N/A    Independent Visualization (ED [place], Wet Prep, Other): No    Discussed patient with NON-ED Provider: None      ED Disposition   ED Disposition: Discharge      ED Clinical Impression   ED Clinical Impression:   Rash  Contact dermatitis, unspecified contact dermatitis type, unspecified trigger      ED Patient Status   Patient Status:   Good        ED Medical Evaluation Initiated   Medical Evaluation Initiated:   Yes, filed at 05/03/16 1600  by Lucretia Field, MD             Lucretia Field, MD  Resident  05/03/16 [phone]

## 2019-04-11 ENCOUNTER — Encounter (HOSPITAL_BASED_OUTPATIENT_CLINIC_OR_DEPARTMENT_OTHER): Payer: Self-pay | Admitting: Student

## 2019-04-11 ENCOUNTER — Other Ambulatory Visit: Payer: Self-pay

## 2019-04-11 ENCOUNTER — Emergency Department (HOSPITAL_BASED_OUTPATIENT_CLINIC_OR_DEPARTMENT_OTHER)
Admission: EM | Admit: 2019-04-11 | Discharge: 2019-04-11 | Disposition: A | Discharge status: Home / Self Care | Payer: BLUE CROSS/BLUE SHIELD | Attending: Emergency Medicine | Admitting: Emergency Medicine

## 2019-04-11 DIAGNOSIS — M545 Low back pain: Secondary | ICD-10-CM | POA: Diagnosis present

## 2019-04-11 DIAGNOSIS — M5441 Lumbago with sciatica, right side: Secondary | ICD-10-CM | POA: Insufficient documentation

## 2019-04-11 DIAGNOSIS — G8929 Other chronic pain: Secondary | ICD-10-CM | POA: Insufficient documentation

## 2019-04-11 MED ORDER — PREDNISONE 10 MG (21) PO TBPK: ORAL_TABLET | Freq: Every day | ORAL | 0 refills | Status: AC

## 2019-04-11 MED ORDER — OXYCODONE-ACETAMINOPHEN 5-325 MG PO TABS
2.0000 | ORAL_TABLET | Freq: Once | ORAL | Status: AC
  Administered 2019-04-11: 10:00:00 2 via ORAL
  Filled 2019-04-11: qty 2

## 2019-04-11 NOTE — ED Notes (Signed)
ED Provider at bedside. 

## 2019-04-11 NOTE — ED Notes (Signed)
Pt discharged to home. Discharge instructions have been discussed with patient and/or family members. Pt verbally acknowledges understanding d/c instructions, and states understanding to checkout at registration before leaving. 

## 2019-04-11 NOTE — Discharge Instructions (Signed)
You were seen in the emergency department today for back pain.  You were given a one-time dose of your oxycodone.  We are sending you home with a steroid pack to try to help with your discomfort.  Please take this as prescribed, be sure to take in the morning with food.  We have prescribed you new medication(s) today. Discuss the medications prescribed today with your pharmacist as they can have adverse effects and interactions with your other medicines including over the counter and prescribed medications. Seek medical evaluation if you start to experience new or abnormal symptoms after taking one of these medicines, seek care immediately if you start to experience difficulty breathing, feeling of your throat closing, facial swelling, or rash as these could be indications of a more serious allergic reaction  Please follow-up with your primary care provider and/or [facility] pain management within 3 to 5 days.  Return to the emergency department for new or worsening symptoms including but not limited to fever, loss of control of your bowel or bladder function, numbness, weakness, inability to walk, or any other concerns.

## 2019-04-11 NOTE — ED Triage Notes (Signed)
Pt c/o right side  lower back pain with radiation into right leg. Pt endorses hx of back pain, scoliosis, and chronic pain r/t degenerative disk disease.

## 2019-04-11 NOTE — ED Provider Notes (Signed)
[facility] EMERGENCY DEPARTMENT Provider Note   CSN: [id number] Arrival date & time: 04/11/19  1696     History Chief Complaint  Patient presents with  . Back Pain    Rachel Hendricks is a 49 y.o. female with a history of chronic back pain, degenerative disc disease, and scoliosis who presents to the emergency department with complaints of back pain which has been ongoing for several years.  Patient states the pain is in her right lower back, radiates down the right lower extremity at times, is a 10 out of 10 in severity, it is worse with movement, no alleviating factors currently.  She was previously seeing [facility] pain management clinic and receiving 10 mg oxycodone tablets for her chronic pain, she recently had a change in insurance which has changed her PCP, they discontinued her narcotics, she has seen several doctors and is very frustrated with her pain control issues.  She is pending a referral back to [facility] pain management by her PCP currently.  She has prescriptions for muscle relaxants and NSAIDs which she is taking some help minimally.  She denies recent falls/injuries or change in activity. Denies numbness, tingling, weakness, saddle anesthesia, incontinence to bowel/bladder, fever, chills, IV drug use, dysuria, or hx of cancer. Patient has not had prior back surgeries.  Denies chance of pregnancy.  HPI     History reviewed. No pertinent past medical history.  There are no problems to display for this patient.   Past Surgical History:  Procedure Laterality Date  . BREAST REDUCTION SURGERY    . HAND TENDON SURGERY    . OVARIAN CYST SURGERY    . TONSILLECTOMY       OB History   No obstetric history on file.     History reviewed. No pertinent family history.  Social History   Tobacco Use  . Smoking status: Never Smoker  Substance Use Topics  . Alcohol use: Yes  . Drug use: Not on file    Home Medications Prior  to Admission medications   Medication Sig Start Date End Date Taking? Authorizing Provider  azithromycin (ZITHROMAX Z-PAK) 250 MG tablet Take 1 tablet (250 mg total) by mouth daily. 500mg  PO day 1, then 250mg  PO days 205 01/28/14   , MD  HYDROcodone-acetaminophen (HYCET) 7.5-325 mg/15 ml solution Take 15 mLs by mouth every 8 (eight) hours as needed for moderate pain. 01/28/14   05-19-1976, MD  naproxen (NAPROSYN) 500 MG tablet Take 1 tablet (500 mg total) by mouth 2 (two) times daily with a meal. 01/28/14   Eber Hong, MD  Throat Lozenges (COUGH DROPS MT) Use as directed 1 lozenge in the mouth or throat daily as needed. Patient used this medication for her cough.    [provider]    Allergies    Penicillins  Review of Systems   Review of Systems  Constitutional: Negative for chills and fever.  Respiratory: Negative for shortness of breath.   Cardiovascular: Negative for chest pain.  Gastrointestinal: Negative for abdominal pain.  Genitourinary: Negative for dysuria and hematuria.  Musculoskeletal: Positive for back pain.  Neurological: Negative for weakness and numbness.       Negative for incontinence or saddle anesthesia.    Physical Exam Updated Vital Signs BP 124/61 (BP Location: Right Arm)   Pulse 74   Temp 98.4 F (36.9 C) (Oral)   Resp 16   SpO2 100%   Physical Exam Vitals and nursing note  reviewed.  Constitutional:      General: She is not in acute distress.    Appearance: She is well-developed. She is obese. She is not toxic-appearing.  HENT:     Head: Normocephalic and atraumatic.  Abdominal:     General: There is no distension.     Palpations: Abdomen is soft.     Tenderness: There is no abdominal tenderness. There is no guarding or rebound.  Musculoskeletal:     Cervical back: Normal range of motion and neck supple. No spinous process tenderness or muscular tenderness.     Comments: No rasgesm appreciable swelling, erythema,  ecchymosis, significant open wounds, or increased warmth.  Extremities: Normal ROM. Nontender.  Back: No point/focal vertebral tenderness, no palpable step off or crepitus. Diffuse lumbar midline tenderness as well as right lumbar paraspinal tenderness which extends to the R gluteal region.   Skin:    General: Skin is warm and dry.     Findings: No rash.  Neurological:     Mental Status: She is alert.     Deep Tendon Reflexes:     Reflex Scores:      Patellar reflexes are 2+ on the right side and 2+ on the left side.    Comments: Sensation grossly intact to bilateral lower extremities. 5/5 symmetric strength with plantar/dorsiflexion bilaterally. Gait is intact without obvious foot drop.      ED Results / Procedures / Treatments   Labs (all labs ordered are listed, but only abnormal results are displayed) Labs Reviewed - No data to display  EKG None  Radiology No results found.  Procedures Procedures (including critical care time)  Medications Ordered in ED Medications  oxyCODONE-acetaminophen (PERCOCET/ROXICET) 5-325 MG per tablet 2 tablet (has no administration in time range)    ED Course  I have reviewed the triage vital signs and the nursing notes.  Pertinent labs & imaging results that were available during my care of the patient were reviewed by me and considered in my medical decision making (see chart for details).  MRI L spine w and wo contrast 04/05/2019   IMPRESSION: 1. At L5-S1 there is a mild broad-based disc bulge with a shallow left paracentral disc protrusion. Moderate left foraminal stenosis. Mild right foraminal stenosis. 2. Marrow heterogeneity is present. Although this can be caused by marrow infiltrative processes, the most common causes include anemia, smoking, obesity, or advancing age.   Electronically Signed  By: Elige Ko  On: 04/05/2019 09:42 MDM Rules/Calculators/A&P                      Patient presents to the emergency  department with chronic back pain, previously on oxycodone which was discontinued due to change of insurance & change in PCP, pending referral to her prior pain management doctor.  Nontoxic, resting comfortably, vitals WNL. No back pain red flags. No urinary sxs, chronic, doubt kidney stone or pyelo.  Patient is afebrile, no history of IVDU, do not suspect epidural abscess.  She has no neurologic deficits, do not suspect cauda equina or cord compression.  She had a recent MRI of the lumbar spine which has been reviewed for additional history as detailed above. Clinical picture not consistent w/ dissection. Will give 1 time dose of her prior oxycodone to help with her pain in the ED, continue her previously prescribed medicines, discharge home with steroid pack pending referral back to her pain management doctor. I discussed treatment plan, need for follow-up, and return precautions with  the patient. Provided opportunity for questions, patient confirmed understanding and is in agreement with plan.   Final Clinical Impression(s) / ED Diagnoses Final diagnoses:  Chronic right-sided low back pain with right-sided sciatica    Rx / DC Orders ED Discharge Orders         Ordered    predniSONE (STERAPRED UNI-PAK 21 TAB) 10 MG (21) TBPK tablet  Daily     04/11/19 0954           Amaryllis Dyke, PA-C 04/11/19 0955    Maudie Flakes, MD 04/12/19 [phone]

## 2021-04-17 ENCOUNTER — Emergency Department (HOSPITAL_BASED_OUTPATIENT_CLINIC_OR_DEPARTMENT_OTHER): Payer: BLUE CROSS/BLUE SHIELD

## 2021-04-17 ENCOUNTER — Encounter (HOSPITAL_BASED_OUTPATIENT_CLINIC_OR_DEPARTMENT_OTHER): Payer: Self-pay

## 2021-04-17 ENCOUNTER — Other Ambulatory Visit: Payer: Self-pay

## 2021-04-17 ENCOUNTER — Emergency Department (HOSPITAL_BASED_OUTPATIENT_CLINIC_OR_DEPARTMENT_OTHER)
Admission: EM | Admit: 2021-04-17 | Discharge: 2021-04-17 | Disposition: A | Discharge status: Home / Self Care | Payer: BLUE CROSS/BLUE SHIELD | Attending: Emergency Medicine | Admitting: Emergency Medicine

## 2021-04-17 DIAGNOSIS — S3992XA Unspecified injury of lower back, initial encounter: Secondary | ICD-10-CM | POA: Insufficient documentation

## 2021-04-17 DIAGNOSIS — S79911A Unspecified injury of right hip, initial encounter: Secondary | ICD-10-CM | POA: Insufficient documentation

## 2021-04-17 DIAGNOSIS — Y92410 Unspecified street and highway as the place of occurrence of the external cause: Secondary | ICD-10-CM | POA: Diagnosis not present

## 2021-04-17 DIAGNOSIS — M25551 Pain in right hip: Secondary | ICD-10-CM

## 2021-04-17 MED ORDER — METHOCARBAMOL 500 MG PO TABS: 500.0000 mg | ORAL_TABLET | Freq: Three times a day (TID) | ORAL | 0 refills | Status: AC | PRN

## 2021-04-17 MED ORDER — DICLOFENAC SODIUM 1 % EX GEL: 2.0000 g | Freq: Four times a day (QID) | CUTANEOUS | 0 refills | Status: AC | PRN

## 2021-04-17 NOTE — ED Notes (Signed)
Patient to xray.
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
## 2021-04-17 NOTE — Discharge Instructions (Signed)
The x-ray of your hip and pelvis did not show any broken bones.  Please take the medication as prescribed.  You may take Tylenol and/or ibuprofen as well.  Robaxin can make you drowsy and you cannot take this with alcohol, other pain medicines, or if you plan to drive a vehicle. ?
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
